# Patient Record
Sex: Female | Born: 1985 | Race: White | Hispanic: No | Marital: Married | Smoking: Current every day smoker
Health system: Southern US, Community
[De-identification: ages and names within clinical notes are randomized; demographics above are authoritative.]

## PROBLEM LIST (undated history)

## (undated) DIAGNOSIS — E0590 Thyrotoxicosis, unspecified without thyrotoxic crisis or storm: Secondary | ICD-10-CM

## (undated) DIAGNOSIS — F32A Depression, unspecified: Secondary | ICD-10-CM

## (undated) DIAGNOSIS — I10 Essential (primary) hypertension: Secondary | ICD-10-CM

## (undated) DIAGNOSIS — F410 Panic disorder [episodic paroxysmal anxiety] without agoraphobia: Secondary | ICD-10-CM

## (undated) DIAGNOSIS — E559 Vitamin D deficiency, unspecified: Secondary | ICD-10-CM

## (undated) DIAGNOSIS — E039 Hypothyroidism, unspecified: Secondary | ICD-10-CM

## (undated) DIAGNOSIS — F329 Major depressive disorder, single episode, unspecified: Secondary | ICD-10-CM

## (undated) DIAGNOSIS — N62 Hypertrophy of breast: Secondary | ICD-10-CM

## (undated) HISTORY — PX: OTHER SURGICAL HISTORY: SHX169

## (undated) HISTORY — DX: Depression, unspecified: F32.A

## (undated) HISTORY — DX: Hypertrophy of breast: N62

## (undated) HISTORY — PX: BREAST REDUCTION SURGERY: SHX8

## (undated) HISTORY — DX: Panic disorder (episodic paroxysmal anxiety): F41.0

## (undated) HISTORY — DX: Hypothyroidism, unspecified: E03.9

## (undated) HISTORY — DX: Major depressive disorder, single episode, unspecified: F32.9

## (undated) HISTORY — PX: COSMETIC SURGERY: SHX468

## (undated) HISTORY — DX: Vitamin D deficiency, unspecified: E55.9

## (undated) HISTORY — PX: APPENDECTOMY: SHX54

## (undated) HISTORY — DX: Essential (primary) hypertension: I10

## (undated) HISTORY — PX: WISDOM TOOTH EXTRACTION: SHX21

## (undated) HISTORY — DX: Thyrotoxicosis, unspecified without thyrotoxic crisis or storm: E05.90

## (undated) HISTORY — PX: BREAST SURGERY: SHX581

## (undated) HISTORY — PX: TONSILLECTOMY: SUR1361

---

## 2009-05-27 ENCOUNTER — Ambulatory Visit: Payer: Self-pay | Admitting: Family Medicine

## 2009-07-02 ENCOUNTER — Ambulatory Visit: Payer: Self-pay | Admitting: Family Medicine

## 2009-07-03 ENCOUNTER — Ambulatory Visit (HOSPITAL_COMMUNITY): Admission: RE | Admit: 2009-07-03 | Discharge: 2009-07-03 | Payer: Self-pay | Admitting: Obstetrics and Gynecology

## 2009-10-19 ENCOUNTER — Ambulatory Visit: Payer: Self-pay | Admitting: Family Medicine

## 2010-05-28 ENCOUNTER — Inpatient Hospital Stay (HOSPITAL_COMMUNITY): Admission: RE | Admit: 2010-05-28 | Discharge: 2010-05-30 | Payer: Self-pay | Admitting: Obstetrics and Gynecology

## 2010-05-28 ENCOUNTER — Encounter (INDEPENDENT_AMBULATORY_CARE_PROVIDER_SITE_OTHER): Payer: Self-pay | Admitting: Obstetrics and Gynecology

## 2010-11-25 LAB — CBC
HCT: 32.3 % — ABNORMAL LOW (ref 36.0–46.0)
Hemoglobin: 11.4 g/dL — ABNORMAL LOW (ref 12.0–15.0)
MCH: 33 pg (ref 26.0–34.0)
MCHC: 35.2 g/dL (ref 30.0–36.0)
MCHC: 35.3 g/dL (ref 30.0–36.0)
MCV: 93.7 fL (ref 78.0–100.0)
Platelets: 240 10*3/uL (ref 150–400)
RBC: 3.17 MIL/uL — ABNORMAL LOW (ref 3.87–5.11)
RDW: 13.6 % (ref 11.5–15.5)
RDW: 13.8 % (ref 11.5–15.5)
WBC: 12.6 10*3/uL — ABNORMAL HIGH (ref 4.0–10.5)
WBC: 15.2 10*3/uL — ABNORMAL HIGH (ref 4.0–10.5)

## 2011-11-28 DIAGNOSIS — Z0271 Encounter for disability determination: Secondary | ICD-10-CM

## 2013-03-14 ENCOUNTER — Ambulatory Visit (HOSPITAL_BASED_OUTPATIENT_CLINIC_OR_DEPARTMENT_OTHER): Payer: 59 | Attending: Family Medicine

## 2013-03-14 VITALS — Ht 66.0 in | Wt 239.0 lb

## 2013-03-14 DIAGNOSIS — R0609 Other forms of dyspnea: Secondary | ICD-10-CM | POA: Insufficient documentation

## 2013-03-14 DIAGNOSIS — R0602 Shortness of breath: Secondary | ICD-10-CM | POA: Insufficient documentation

## 2013-03-14 DIAGNOSIS — G4700 Insomnia, unspecified: Secondary | ICD-10-CM | POA: Insufficient documentation

## 2013-03-14 DIAGNOSIS — R0989 Other specified symptoms and signs involving the circulatory and respiratory systems: Secondary | ICD-10-CM | POA: Insufficient documentation

## 2013-03-14 DIAGNOSIS — G4733 Obstructive sleep apnea (adult) (pediatric): Secondary | ICD-10-CM

## 2013-03-14 DIAGNOSIS — R5381 Other malaise: Secondary | ICD-10-CM | POA: Insufficient documentation

## 2013-03-14 DIAGNOSIS — R51 Headache: Secondary | ICD-10-CM | POA: Insufficient documentation

## 2013-03-16 DIAGNOSIS — R5381 Other malaise: Secondary | ICD-10-CM

## 2013-03-16 DIAGNOSIS — G4700 Insomnia, unspecified: Secondary | ICD-10-CM

## 2013-03-16 DIAGNOSIS — R0609 Other forms of dyspnea: Secondary | ICD-10-CM

## 2013-03-16 DIAGNOSIS — R0989 Other specified symptoms and signs involving the circulatory and respiratory systems: Secondary | ICD-10-CM

## 2013-03-16 DIAGNOSIS — R5383 Other fatigue: Secondary | ICD-10-CM

## 2016-08-18 ENCOUNTER — Ambulatory Visit (INDEPENDENT_AMBULATORY_CARE_PROVIDER_SITE_OTHER): Payer: 59 | Admitting: Obstetrics and Gynecology

## 2016-08-18 ENCOUNTER — Encounter: Payer: Self-pay | Admitting: Obstetrics and Gynecology

## 2016-08-18 VITALS — BP 146/104 | HR 79 | Wt 128.0 lb

## 2016-08-18 DIAGNOSIS — F520 Hypoactive sexual desire disorder: Secondary | ICD-10-CM | POA: Diagnosis not present

## 2016-08-18 MED ORDER — FLIBANSERIN 100 MG PO TABS: 100.0000 mg | ORAL_TABLET | Freq: Every day | ORAL | 4 refills | Status: DC

## 2016-08-18 NOTE — Progress Notes (Signed)
Patient was referred for her libido- estratest worked for a while and then stopped. Patient took it for 2 months. Patient has no sex drive and a hard time getting to climax. Patient noticed after childbirth in 2011.  Pap 11/20/2013

## 2016-08-18 NOTE — Progress Notes (Signed)
30 yo referred for the evaluation of decreased libido. Patient reports onset of symptoms since the birth of her son in 2011. She reports decrease desire and difficult time to achieve an orgasm. She was diagnosed with depression subsequently and started on prozac. Her depressive symptoms improved but her decreased libido remains. She recently took estro-test with some improvement in symptoms but it stopped working. She reports an otherwise good relationship with her partner.   Past Medical History:  Diagnosis Date  . Breast hypertrophy in female   . Depression   . Hypertension   . Hyperthyroidism   . Panic disorder without agoraphobia   . Vitamin D deficiency    Past Surgical History:  Procedure Laterality Date  . BREAST REDUCTION SURGERY    . history of sleeve gastrectomy    . lipotripsy    . tummy tuck     Family History  Problem Relation Age of Onset  . Multiple sclerosis Mother   . Hypertension Father    Social History  Substance Use Topics  . Smoking status: Former Games developermoker  . Smokeless tobacco: Never Used  . Alcohol use Yes     Comment: occasional   ROS See pertinent in HPI  Blood pressure (!) 146/104, pulse 79, weight 128 lb (58.1 kg), last menstrual period 07/24/2016. GENERAL: Well-developed, well-nourished female in no acute distress.  HEENT: Normocephalic, atraumatic. Sclerae anicteric.  NECK: Supple. Normal thyroid.  LUNGS: Clear to auscultation bilaterally.  HEART: Regular rate and rhythm. BREASTS: Symmetric in size. No palpable masses or lymphadenopathy, skin changes, or nipple drainage. ABDOMEN: Soft, nontender, nondistended. No organomegaly. PELVIC: Normal external female genitalia. Vagina is pink and rugated.  Normal discharge. Normal appearing cervix. Uterus is normal in size.  No adnexal mass or tenderness. EXTREMITIES: No cyanosis, clubbing, or edema, 2+ distal pulses.  A/P 30 yo with hypoactive sexual desire - Discussed elevated BP today and to follow up  with PCP for further management - Discussed side effect of prozac to contribute to decrease libido - Rx Addyi provided with $25 co-pay card. Advised to avoid the use of alcohol - Discussed the added benefits of couples therapy and sex therapy in conjunction with treatment - RTC prn

## 2016-11-08 ENCOUNTER — Telehealth: Payer: Self-pay | Admitting: *Deleted

## 2016-11-08 NOTE — Telephone Encounter (Signed)
Patient is calling to let Doctor know that the Addyi is not working. She wants to know if she should just stop it. She has been using it for 3 months and has noticed no difference. Told patient I would forward the message and let her know.

## 2016-11-09 NOTE — Telephone Encounter (Signed)
Call to patient and discussed compounding alternatives. She was given number to [facility] She is to call back with her choice and let us know. Dr Jolayne Pantheronstant is aware.

## 2024-07-23 ENCOUNTER — Ambulatory Visit: Admitting: Family Medicine

## 2024-09-17 ENCOUNTER — Encounter: Payer: Self-pay | Admitting: Family Medicine

## 2024-09-17 ENCOUNTER — Ambulatory Visit (INDEPENDENT_AMBULATORY_CARE_PROVIDER_SITE_OTHER): Admitting: Family Medicine

## 2024-09-17 VITALS — BP 140/100 | HR 107 | Temp 98.0°F | Ht 65.0 in | Wt 153.0 lb

## 2024-09-17 DIAGNOSIS — E0590 Thyrotoxicosis, unspecified without thyrotoxic crisis or storm: Secondary | ICD-10-CM | POA: Insufficient documentation

## 2024-09-17 DIAGNOSIS — E039 Hypothyroidism, unspecified: Secondary | ICD-10-CM | POA: Diagnosis not present

## 2024-09-17 DIAGNOSIS — E559 Vitamin D deficiency, unspecified: Secondary | ICD-10-CM | POA: Diagnosis not present

## 2024-09-17 DIAGNOSIS — E663 Overweight: Secondary | ICD-10-CM | POA: Diagnosis not present

## 2024-09-17 DIAGNOSIS — R5383 Other fatigue: Secondary | ICD-10-CM | POA: Diagnosis not present

## 2024-09-17 DIAGNOSIS — I10 Essential (primary) hypertension: Secondary | ICD-10-CM | POA: Diagnosis not present

## 2024-09-17 DIAGNOSIS — Z114 Encounter for screening for human immunodeficiency virus [HIV]: Secondary | ICD-10-CM

## 2024-09-17 DIAGNOSIS — Z1159 Encounter for screening for other viral diseases: Secondary | ICD-10-CM

## 2024-09-17 DIAGNOSIS — Z7689 Persons encountering health services in other specified circumstances: Secondary | ICD-10-CM | POA: Diagnosis not present

## 2024-09-17 DIAGNOSIS — Z124 Encounter for screening for malignant neoplasm of cervix: Secondary | ICD-10-CM | POA: Diagnosis not present

## 2024-09-17 MED ORDER — OLMESARTAN MEDOXOMIL 20 MG PO TABS: 20.0000 mg | ORAL_TABLET | Freq: Every day | ORAL | 0 refills | Status: DC

## 2024-09-17 NOTE — Patient Instructions (Addendum)
-  It was nice to meet you and look forward to taking care of you. -Placed a referral to GYN for cervical cancer screening (pap smear). Please call the office or send a MyChart message if you do not receive a phone call or a MyChart message about appointment in 2 weeks.  -Ordered labs. Please schedule a lab appointment when able to fast. Office will call with lab results and will be available via MyChart.  -Blood pressure is elevated. Prescribed Olmesartan  20mg  daily. Discussed about common side effects of medication. Recommend to monitor blood pressure twice a day, once in the AM and once in the PM, and bring in reading to a 2 week follow up. -Follow up in 2 weeks.

## 2024-09-17 NOTE — Progress Notes (Signed)
 "  New Patient Office Visit  Subjective   Patient ID: Theresa Martinez, female    DOB: [birth year]  Age: 39 y.o. MRN: [id number]  CC:  Chief Complaint  Patient presents with   Establish Care    HPI Theresa Martinez presents to establish care with new provider.  Patients previous primary care provider: [facility] with Dr. Lamar Cornet. Last seen in 2018.   Specialist: Growth Therapy with [place] Gala, NP-monthly telehealth appointments   HTN: Chronic. Patient has a history of HTN. Previously been on Lisinopril-HCTZ 20-25mg  with last provider back in 2018. Uncertain if it worked.  Not been monitoring blood pressure at home. Denies CP, HA, or lower extremity edema. Will have some dizziness/lightlessness, and SHOB.   Outpatient Encounter Medications as of 09/17/2024  Medication Sig   amphetamine-dextroamphetamine (ADDERALL XR) 25 MG 24 hr capsule Take 25 mg by mouth every morning.   busPIRone (BUSPAR) 10 MG tablet Take 10 mg by mouth 3 (three) times daily.   cloNIDine (CATAPRES) 0.3 MG tablet Take 0.3 mg by mouth at bedtime.   escitalopram (LEXAPRO) 20 MG tablet Take 20 mg by mouth daily.   gabapentin (NEURONTIN) 100 MG capsule Take 100 mg by mouth 3 (three) times daily.   hydrOXYzine (ATARAX) 25 MG tablet Take 25 mg by mouth 3 (three) times daily as needed.   Multiple Vitamin (MULTI VITAMIN PO) Take by mouth.   olmesartan  (BENICAR ) 20 MG tablet Take 1 tablet (20 mg total) by mouth daily.   traZODone (DESYREL) 50 MG tablet Take 50 mg by mouth at bedtime.   [DISCONTINUED] Biotin 10 MG CAPS Take by mouth.   [DISCONTINUED] cholecalciferol (VITAMIN D) 1000 units tablet Take 2,000 Units by mouth daily.   [DISCONTINUED] Flibanserin  (ADDYI ) 100 MG TABS Take 100 mg by mouth at bedtime.   [DISCONTINUED] FLUoxetine (PROZAC) 20 MG tablet Take 40 mg by mouth daily.   [DISCONTINUED] levothyroxine (SYNTHROID, LEVOTHROID) 88 MCG tablet Take 88 mcg by mouth daily before  breakfast.   [DISCONTINUED] vitamin B-12 (CYANOCOBALAMIN) 500 MCG tablet Take 500 mcg by mouth daily.   [DISCONTINUED] zolpidem (AMBIEN) 10 MG tablet Take 10 mg by mouth at bedtime as needed for sleep.   No facility-administered encounter medications on file as of 09/17/2024.    Past Medical History:  Diagnosis Date   Breast hypertrophy in female    Depression    Hypertension    Hypothyroidism    Panic disorder without agoraphobia    Vitamin D deficiency     Past Surgical History:  Procedure Laterality Date   APPENDECTOMY     BREAST REDUCTION SURGERY     BREAST SURGERY     COSMETIC SURGERY     history of sleeve gastrectomy     lipotripsy     TONSILLECTOMY     tummy tuck     WISDOM TOOTH EXTRACTION      Family History  Problem Relation Age of Onset   Multiple sclerosis Mother    Arthritis Mother    Miscarriages / Stillbirths Mother    Hypertension Father    Mental illness Daughter    Glaucoma Paternal Grandmother    Alzheimer's disease Paternal Grandmother     Social History   Socioeconomic History   Marital status: Married    Spouse name: Not on file   Number of children: 2   Years of education: Not on file   Highest education level: 10th grade  Occupational History  Not on file  Tobacco Use   Smoking status: Every Day    Current packs/day: 0.25    Average packs/day: 0.3 packs/day for 23.0 years (5.8 ttl pk-yrs)    Types: Cigarettes, E-cigarettes   Smokeless tobacco: Never  Vaping Use   Vaping status: Every Day  Substance and Sexual Activity   Alcohol use: Yes    Alcohol/week: 2.0 standard drinks of alcohol    Types: 2 Standard drinks or equivalent per week   Drug use: Yes    Types: Marijuana    Comment: I use for medicinal purposes (pain, anxiety, to eat, etc)   Sexual activity: Yes    Partners: Male    Birth control/protection: None    Comment: husband has had vasectomy  Other Topics Concern   Not on file  Social History Narrative   Not on  file   Social Drivers of Health   Tobacco Use: High Risk (09/17/2024)   Patient History    Smoking Tobacco Use: Every Day    Smokeless Tobacco Use: Never    Passive Exposure: Not on file  Financial Resource Strain: Medium Risk (09/10/2024)   Overall Financial Resource Strain (CARDIA)    Difficulty of Paying Living Expenses: Somewhat hard  Food Insecurity: Food Insecurity Present (09/10/2024)   Epic    Worried About Programme Researcher, Broadcasting/film/video in the Last Year: Sometimes true    Ran Out of Food in the Last Year: Never true  Transportation Needs: No Transportation Needs (09/10/2024)   Epic    Lack of Transportation (Medical): No    Lack of Transportation (Non-Medical): No  Physical Activity: Inactive (09/10/2024)   Exercise Vital Sign    Days of Exercise per Week: 0 days    Minutes of Exercise per Session: Not on file  Stress: Stress Concern Present (09/10/2024)   [employer] of Occupational Health - Occupational Stress Questionnaire    Feeling of Stress: Rather much  Social Connections: Socially Isolated (09/10/2024)   Social Connection and Isolation Panel    Frequency of Communication with Friends and Family: Twice a week    Frequency of Social Gatherings with Friends and Family: Never    Attends Religious Services: Never    Database Administrator or Organizations: No    Attends Engineer, Structural: Not on file    Marital Status: Married  Catering Manager Violence: Not At Risk (09/17/2024)   Epic    Fear of Current or Ex-Partner: No    Emotionally Abused: No    Physically Abused: No    Sexually Abused: No  Depression (PHQ2-9): Low Risk (09/17/2024)   Depression (PHQ2-9)    PHQ-2 Score: 3  Alcohol Screen: Low Risk (09/10/2024)   Alcohol Screen    Last Alcohol Screening Score (AUDIT): 3  Housing: Low Risk (09/10/2024)   Epic    Unable to Pay for Housing in the Last Year: No    Number of Times Moved in the Last Year: 0    Homeless in the Last Year: No  Utilities:  Not At Risk (09/17/2024)   Epic    Threatened with loss of utilities: No  Health Literacy: Adequate Health Literacy (09/17/2024)   B1300 Health Literacy    Frequency of need for help with medical instructions: Never    ROS See HPI above    Objective  BP (!) 140/100   Pulse (!) 107   Temp 98 F (36.7 C) (Oral)   Ht 5' 5 (1.651 m)   Wt  153 lb (69.4 kg)   SpO2 99%   BMI 25.46 kg/m   Physical Exam Vitals reviewed.  Constitutional:      General: She is not in acute distress.    Appearance: Normal appearance. She is overweight. She is not ill-appearing, toxic-appearing or diaphoretic.  HENT:     Head: Normocephalic and atraumatic.  Eyes:     General:        Right eye: No discharge.        Left eye: No discharge.     Conjunctiva/sclera: Conjunctivae normal.  Cardiovascular:     Rate and Rhythm: Normal rate and regular rhythm.     Heart sounds: Normal heart sounds. No murmur heard.    No friction rub. No gallop.  Pulmonary:     Effort: Pulmonary effort is normal. No respiratory distress.     Breath sounds: Normal breath sounds.  Musculoskeletal:        General: Normal range of motion.  Skin:    General: Skin is warm and dry.  Neurological:     General: No focal deficit present.     Mental Status: She is alert and oriented to person, place, and time. Mental status is at baseline.  Psychiatric:        Mood and Affect: Mood normal.        Behavior: Behavior normal.        Thought Content: Thought content normal.        Judgment: Judgment normal.      Assessment & Plan:  Primary hypertension Assessment & Plan: Blood pressure is elevated. Prescribed Olmesartan  20mg  daily. Discussed about common side effects of medication. Recommend to monitor blood pressure twice a day, once in the AM and once in the PM, and bring in reading to a 2 week follow up. Ordered CMP.   Orders: -     Olmesartan  Medoxomil; Take 1 tablet (20 mg total) by mouth daily.  Dispense: 30 tablet; Refill:  0 -     Comprehensive metabolic panel with GFR; Future  Cervical cancer screening -     Ambulatory referral to Gynecology  Overweight (BMI 25.0-29.9) -     CBC with Differential/Platelet; Future -     Comprehensive metabolic panel with GFR; Future -     Hemoglobin A1c; Future -     Lipid panel; Future  Vitamin D deficiency -     VITAMIN D 25 Hydroxy (Vit-D Deficiency, Fractures); Future  Encounter for screening for HIV -     HIV Antibody (routine testing w rflx); Future  Need for hepatitis C screening test -     Hepatitis C antibody; Future  Fatigue, unspecified type -     CBC with Differential/Platelet; Future -     Comprehensive metabolic panel with GFR; Future -     TSH; Future -     T4, free; Future -     T3, free; Future -     VITAMIN D 25 Hydroxy (Vit-D Deficiency, Fractures); Future -     Vitamin B12; Future  Hypothyroidism, unspecified type -     TSH; Future -     T4, free; Future -     T3, free; Future  Encounter to establish care  1.Review health maintenance:  -Cervical cancer screening: Placed a referral to GYN  -HIV and Hep C screening: Order  -Tdap vaccine: Unknown  -PNA vaccine: Not had  -Hep B vaccines: Probably had it previously  -HPV vaccine: Unknown  -Influenza vaccine: Declines  -  Covid vaccine: Declines  2.Ordered labs based on HTN, history of hypothyroidism, vitamin D deficiency, fatigue, BMI, and screening. Patient is not fasting and will make lab appointment. Advised to be fasting for lab collection.  Return in about 2 weeks (around 10/01/2024) for follow-up; Lab appointment-fasting .   Prateek Knipple, NP "

## 2024-09-17 NOTE — Assessment & Plan Note (Addendum)
 Blood pressure is elevated. Prescribed Olmesartan  20mg  daily. Discussed about common side effects of medication. Recommend to monitor blood pressure twice a day, once in the AM and once in the PM, and bring in reading to a 2 week follow up. Ordered CMP.

## 2024-09-19 ENCOUNTER — Encounter: Payer: Self-pay | Admitting: Family Medicine

## 2024-09-19 ENCOUNTER — Other Ambulatory Visit: Payer: Self-pay

## 2024-09-19 DIAGNOSIS — I10 Essential (primary) hypertension: Secondary | ICD-10-CM

## 2024-09-19 MED ORDER — OLMESARTAN MEDOXOMIL 20 MG PO TABS: 20.0000 mg | ORAL_TABLET | Freq: Every day | ORAL | 0 refills | Status: AC

## 2024-09-20 ENCOUNTER — Ambulatory Visit: Payer: Self-pay | Admitting: Family Medicine

## 2024-09-20 ENCOUNTER — Other Ambulatory Visit

## 2024-09-20 DIAGNOSIS — I10 Essential (primary) hypertension: Secondary | ICD-10-CM | POA: Diagnosis not present

## 2024-09-20 DIAGNOSIS — E559 Vitamin D deficiency, unspecified: Secondary | ICD-10-CM | POA: Diagnosis not present

## 2024-09-20 DIAGNOSIS — Z1159 Encounter for screening for other viral diseases: Secondary | ICD-10-CM

## 2024-09-20 DIAGNOSIS — E663 Overweight: Secondary | ICD-10-CM

## 2024-09-20 DIAGNOSIS — E039 Hypothyroidism, unspecified: Secondary | ICD-10-CM

## 2024-09-20 DIAGNOSIS — R5383 Other fatigue: Secondary | ICD-10-CM | POA: Diagnosis not present

## 2024-09-20 DIAGNOSIS — Z114 Encounter for screening for human immunodeficiency virus [HIV]: Secondary | ICD-10-CM

## 2024-09-20 LAB — CBC WITH DIFFERENTIAL/PLATELET
Basophils Absolute: 0 K/uL (ref 0.0–0.1)
Basophils Relative: 0.5 % (ref 0.0–3.0)
Eosinophils Absolute: 0.2 K/uL (ref 0.0–0.7)
Eosinophils Relative: 3.5 % (ref 0.0–5.0)
HCT: 37.4 % (ref 36.0–46.0)
Hemoglobin: 12.8 g/dL (ref 12.0–15.0)
Lymphocytes Relative: 41.4 % (ref 12.0–46.0)
Lymphs Abs: 2.1 K/uL (ref 0.7–4.0)
MCHC: 34.2 g/dL (ref 30.0–36.0)
MCV: 92.3 fl (ref 78.0–100.0)
Monocytes Absolute: 0.4 K/uL (ref 0.1–1.0)
Monocytes Relative: 7.2 % (ref 3.0–12.0)
Neutro Abs: 2.4 K/uL (ref 1.4–7.7)
Neutrophils Relative %: 47.4 % (ref 43.0–77.0)
Platelets: 278 K/uL (ref 150.0–400.0)
RBC: 4.05 Mil/uL (ref 3.87–5.11)
RDW: 13.7 % (ref 11.5–15.5)
WBC: 5 K/uL (ref 4.0–10.5)

## 2024-09-20 LAB — LIPID PANEL
Cholesterol: 171 mg/dL (ref 28–200)
HDL: 54.6 mg/dL
LDL Cholesterol: 97 mg/dL (ref 10–99)
NonHDL: 116.19
Total CHOL/HDL Ratio: 3
Triglycerides: 95 mg/dL (ref 10.0–149.0)
VLDL: 19 mg/dL (ref 0.0–40.0)

## 2024-09-20 LAB — COMPREHENSIVE METABOLIC PANEL WITH GFR
ALT: 9 U/L (ref 3–35)
AST: 15 U/L (ref 5–37)
Albumin: 4.3 g/dL (ref 3.5–5.2)
Alkaline Phosphatase: 73 U/L (ref 39–117)
BUN: 15 mg/dL (ref 6–23)
CO2: 29 meq/L (ref 19–32)
Calcium: 9.3 mg/dL (ref 8.4–10.5)
Chloride: 104 meq/L (ref 96–112)
Creatinine, Ser: 0.72 mg/dL (ref 0.40–1.20)
GFR: 106.26 mL/min
Glucose, Bld: 86 mg/dL (ref 70–99)
Potassium: 4.1 meq/L (ref 3.5–5.1)
Sodium: 140 meq/L (ref 135–145)
Total Bilirubin: 0.4 mg/dL (ref 0.2–1.2)
Total Protein: 7.1 g/dL (ref 6.0–8.3)

## 2024-09-20 LAB — HEMOGLOBIN A1C: Hgb A1c MFr Bld: 5.1 % (ref 4.6–6.5)

## 2024-09-20 LAB — T4, FREE: Free T4: 0.72 ng/dL (ref 0.60–1.60)

## 2024-09-20 LAB — TSH: TSH: 3.57 u[IU]/mL (ref 0.35–5.50)

## 2024-09-20 LAB — VITAMIN D 25 HYDROXY (VIT D DEFICIENCY, FRACTURES): VITD: 34.27 ng/mL (ref 30.00–100.00)

## 2024-09-20 LAB — T3, FREE: T3, Free: 3.3 pg/mL (ref 2.3–4.2)

## 2024-09-20 LAB — VITAMIN B12: Vitamin B-12: 355 pg/mL (ref 211–911)

## 2024-09-21 LAB — HIV ANTIBODY (ROUTINE TESTING W REFLEX)
HIV 1&2 Ab, 4th Generation: NONREACTIVE
HIV FINAL INTERPRETATION: NEGATIVE

## 2024-09-21 LAB — HEPATITIS C ANTIBODY: Hepatitis C Ab: NONREACTIVE

## 2024-10-01 ENCOUNTER — Ambulatory Visit (INDEPENDENT_AMBULATORY_CARE_PROVIDER_SITE_OTHER): Admitting: Family Medicine

## 2024-10-01 ENCOUNTER — Encounter: Payer: Self-pay | Admitting: Family Medicine

## 2024-10-01 VITALS — BP 106/80 | HR 75 | Temp 98.3°F | Ht 65.0 in | Wt 158.0 lb

## 2024-10-01 DIAGNOSIS — I10 Essential (primary) hypertension: Secondary | ICD-10-CM | POA: Diagnosis not present

## 2024-10-01 NOTE — Patient Instructions (Addendum)
-  It was good to see you today.  -Blood pressure is stable. Continue Olmesartan  20mg  daily. Continue to monitor blood pressure 2-3 times a week. If it becomes consistently elevated or continues with dizziness, follow up sooner.  -Provided referral letter to schedule appointment with GYN. -Follow up in 6 months for a physical.

## 2024-10-01 NOTE — Progress Notes (Signed)
" ° °  Established Patient Office Visit   Subjective:  Patient ID: Theresa Martinez, female    DOB: 09-13-1985  Age: 39 y.o. MRN: [id number]  Chief Complaint  Patient presents with   Medical Management of Chronic Issues    2 week follow up     HPI HTN: On previous appointment, prescribed Olmesartan  20mg  daily with recommendations to monitor BP BID. She has been monitoring her blood pressure at home. Ranging [phone]. There as a few low BP with reports of no symptoms and a few elevated that she reports could be stressed induced. Overall, blood pressures are stable. Denies any symptoms except having one episode of dizziness.  BP Readings from Last 3 Encounters:  10/01/24 106/80  09/17/24 (!) 140/100  08/18/16 (!) 146/104    ROS See HPI above     Objective:   BP 106/80   Pulse 75   Temp 98.3 F (36.8 C) (Oral)   Ht 5' 5 (1.651 m)   Wt 158 lb (71.7 kg)   SpO2 99%   BMI 26.29 kg/m     Physical Exam Vitals reviewed.  Constitutional:      General: She is not in acute distress.    Appearance: Normal appearance. She is not ill-appearing, toxic-appearing or diaphoretic.  HENT:     Head: Normocephalic and atraumatic.  Eyes:     General:        Right eye: No discharge.        Left eye: No discharge.     Conjunctiva/sclera: Conjunctivae normal.  Cardiovascular:     Rate and Rhythm: Normal rate and regular rhythm.     Heart sounds: Normal heart sounds. No murmur heard.    No friction rub. No gallop.  Pulmonary:     Effort: Pulmonary effort is normal. No respiratory distress.     Breath sounds: Normal breath sounds.  Musculoskeletal:        General: Normal range of motion.  Skin:    General: Skin is warm and dry.  Neurological:     General: No focal deficit present.     Mental Status: She is alert and oriented to person, place, and time. Mental status is at baseline.  Psychiatric:        Mood and Affect: Mood normal.        Behavior: Behavior normal.        Thought  Content: Thought content normal.        Judgment: Judgment normal.      Assessment & Plan:  Primary hypertension Assessment & Plan: Blood pressure is stable. Continue Olmesartan  20mg  daily. Continue to monitor blood pressure 2-3 times a week. If it becomes consistently elevated or continues with dizziness, follow up sooner.    -Provided referral letter to schedule appointment with GYN.   Return in about 6 months (around 03/31/2025) for physical.   Milayna Rotenberg, NP "

## 2024-10-01 NOTE — Assessment & Plan Note (Signed)
 Blood pressure is stable. Continue Olmesartan  20mg  daily. Continue to monitor blood pressure 2-3 times a week. If it becomes consistently elevated or continues with dizziness, follow up sooner.

## 2025-03-31 ENCOUNTER — Encounter: Admitting: Family Medicine
